# Patient Record
Sex: Female | Born: 1995 | Race: Black or African American | Hispanic: No | Marital: Single | Smoking: Never smoker
Health system: Southern US, Community
[De-identification: ages and names within clinical notes are randomized; demographics above are authoritative.]

## PROBLEM LIST (undated history)

## (undated) DIAGNOSIS — M1990 Unspecified osteoarthritis, unspecified site: Secondary | ICD-10-CM

---

## 2014-02-04 ENCOUNTER — Emergency Department (HOSPITAL_COMMUNITY): Payer: PRIVATE HEALTH INSURANCE

## 2014-02-04 ENCOUNTER — Encounter (HOSPITAL_COMMUNITY): Payer: Self-pay | Admitting: Emergency Medicine

## 2014-02-04 ENCOUNTER — Emergency Department (INDEPENDENT_AMBULATORY_CARE_PROVIDER_SITE_OTHER): Payer: PRIVATE HEALTH INSURANCE

## 2014-02-04 ENCOUNTER — Emergency Department (INDEPENDENT_AMBULATORY_CARE_PROVIDER_SITE_OTHER)
Admission: EM | Admit: 2014-02-04 | Discharge: 2014-02-04 | Disposition: A | Discharge status: Home / Self Care | Payer: PRIVATE HEALTH INSURANCE | Source: Home / Self Care | Attending: Family Medicine | Admitting: Family Medicine

## 2014-02-04 DIAGNOSIS — S62609B Fracture of unspecified phalanx of unspecified finger, initial encounter for open fracture: Secondary | ICD-10-CM

## 2014-02-04 DIAGNOSIS — IMO0002 Reserved for concepts with insufficient information to code with codable children: Secondary | ICD-10-CM

## 2014-02-04 HISTORY — DX: Unspecified osteoarthritis, unspecified site: M19.90

## 2014-02-04 MED ORDER — CEPHALEXIN 500 MG PO CAPS: 500.0000 mg | ORAL_CAPSULE | Freq: Three times a day (TID) | ORAL | Status: AC

## 2014-02-04 MED ORDER — HYDROCODONE-ACETAMINOPHEN 5-325 MG PO TABS: 1.0000 | ORAL_TABLET | ORAL | Status: AC | PRN

## 2014-02-04 NOTE — ED Notes (Signed)
Left ring finger injury yesterday, closed in a door

## 2014-02-04 NOTE — Discharge Instructions (Signed)
Finger Fracture °Fractures of fingers are breaks in the bones of the fingers. There are many types of fractures. There are different ways of treating these fractures. Your health care provider will discuss the best way to treat your fracture. °CAUSES °Traumatic injury is the main cause of broken fingers. These include: °· Injuries while playing sports. °· Workplace injuries. °· Falls. °RISK FACTORS °Activities that can increase your risk of finger fractures include: °· Sports. °· Workplace activities that involve machinery. °· A condition called osteoporosis, which can make your bones less dense and cause them to fracture more easily. °SIGNS AND SYMPTOMS °The main symptoms of a broken finger are pain and swelling within 15 minutes after the injury. Other symptoms include: °· Bruising of your finger. °· Stiffness of your finger. °· Numbness of your finger. °· Exposed bones (compound fracture) if the fracture is severe. °DIAGNOSIS  °The best way to diagnose a broken bone is with X-ray imaging. Additionally, your health care provider will use this X-ray image to evaluate the position of the broken finger bones.  °TREATMENT  °Finger fractures can be treated with:  °· Nonreduction--This means the bones are in place. The finger is splinted without changing the positions of the bone pieces. The splint is usually left on for about a week to 10 days. This will depend on your fracture and what your health care provider thinks. °· Closed reduction--The bones are put back into position without using surgery. The finger is then splinted. °· Open reduction and internal fixation--The fracture site is opened. Then the bone pieces are fixed into place with pins or some type of hardware. This is seldom required. It depends on the severity of the fracture. °HOME CARE INSTRUCTIONS  °· Follow your health care provider's instructions regarding activities, exercises, and physical therapy. °· Only take over-the-counter or prescription  medicines for pain, discomfort, or fever as directed by your health care provider. °SEEK MEDICAL CARE IF: °You have pain or swelling that limits the motion or use of your fingers. °SEEK IMMEDIATE MEDICAL CARE IF:  °Your finger becomes numb. °MAKE SURE YOU:  °· Understand these instructions. °· Will watch your condition. °· Will get help right away if you are not doing well or get worse. °Document Released: 08/16/2000 Document Revised: 02/22/2013 Document Reviewed: 12/14/2012 °[employer]® Patient Information ©2015 [employer]. This information is not intended to replace advice given to you by your health care provider. Make sure you discuss any questions you have with your health care provider. ° °Cast or Splint Care °Casts and splints support injured limbs and keep bones from moving while they heal.  °HOME CARE °· Keep the cast or splint uncovered during the drying period. °¨ A plaster cast can take 24 to 48 hours to dry. °¨ A fiberglass cast will dry in less than 1 hour. °· Do not rest the cast on anything harder than a pillow for 24 hours. °· Do not put weight on your injured limb. Do not put pressure on the cast. Wait for your doctor's approval. °· Keep the cast or splint dry. °¨ Cover the cast or splint with a plastic bag during baths or wet weather. °¨ If you have a cast over your chest and belly (trunk), take sponge baths until the cast is taken off. °¨ If your cast gets wet, dry it with a towel or blow dryer. Use the cool setting on the blow dryer. °· Keep your cast or splint clean. Wash a dirty cast with a damp   cloth. °· Do not put any objects under your cast or splint. °· Do not scratch the skin under the cast with an object. If itching is a problem, use a blow dryer on a cool setting over the itchy area. °· Do not trim or cut your cast. °· Do not take out the padding from inside your cast. °· Exercise your joints near the cast as told by your doctor. °· Raise (elevate) your injured limb on 1 or 2 pillows  for the first 1 to 3 days. °GET HELP IF: °· Your cast or splint cracks. °· Your cast or splint is too tight or too loose. °· You itch badly under the cast. °· Your cast gets wet or has a soft spot. °· You have a bad smell coming from the cast. °· You get an object stuck under the cast. °· Your skin around the cast becomes red or sore. °· You have new or more pain after the cast is put on. °GET HELP RIGHT AWAY IF: °· You have fluid leaking through the cast. °· You cannot move your fingers or toes. °· Your fingers or toes turn blue or white or are cool, painful, or puffy (swollen). °· You have tingling or lose feeling (numbness) around the injured area. °· You have bad pain or pressure under the cast. °· You have trouble breathing or have shortness of breath. °· You have chest pain. °Document Released: 09/03/2010 Document Revised: 01/04/2013 Document Reviewed: 11/10/2012 °[employer]® Patient Information ©2015 [employer]. This information is not intended to replace advice given to you by your health care provider. Make sure you discuss any questions you have with your health care provider. ° °

## 2014-02-04 NOTE — ED Provider Notes (Signed)
CSN: [id number]     Arrival date & time 02/04/14  1541 History   First MD Initiated Contact with Patient 02/04/14 1552     Chief Complaint  Patient presents with  . Finger Injury   (Consider location/radiation/quality/duration/timing/severity/associated sxs/prior Treatment)  HPI  Patient is a 18 year old female who slammed her fourth left finger in a door yesterday. Patient presenting for finger pain and bleeding from under the nail bed.  Patient confirms last tetanus 3 years ago.  Past Medical History  Diagnosis Date  . Arthritis    History reviewed. No pertinent past surgical history. History reviewed. No pertinent family history. History  Substance Use Topics  . Smoking status: Never Smoker   . Smokeless tobacco: Not on file  . Alcohol Use: No   OB History   Grav Para Term Preterm Abortions TAB SAB Ect Mult Living                 Review of Systems  Constitutional: Negative.   HENT: Negative.   Eyes: Negative.   Respiratory: Negative.   Cardiovascular: Negative.   Gastrointestinal: Negative.   Endocrine: Negative.   Genitourinary: Negative.   Skin: Positive for wound. Negative for color change, pallor and rash.  Allergic/Immunologic: Negative.   Neurological: Negative.  Negative for numbness.  Hematological: Negative.   Psychiatric/Behavioral: Negative.     Allergies  Review of patient's allergies indicates not on file.  Home Medications   Prior to Admission medications   Medication Sig Start Date End Date Taking? Authorizing Provider  norethindrone-ethinyl estradiol (JUNEL FE,GILDESS FE,LOESTRIN FE) 1-20 MG-MCG tablet Take 1 tablet by mouth daily.   Yes Historical Provider, MD  cephALEXin (KEFLEX) 500 MG capsule Take 1 capsule (500 mg total) by mouth 3 (three) times daily. 02/04/14   Servando Salina, NP  HYDROcodone-acetaminophen (NORCO/VICODIN) 5-325 MG per tablet Take 1-2 tablets by mouth every 4 (four) hours as needed. 02/04/14   Servando Salina, NP    BP 137/80  Pulse 75  Temp(Src) 98.5 F (36.9 C) (Oral)  Resp 16  LMP 01/21/2014  Physical Exam  Nursing note and vitals reviewed. Constitutional: She is oriented to person, place, and time. She appears well-developed and well-nourished. No distress.  Cardiovascular: Normal rate, regular rhythm, normal heart sounds and intact distal pulses.  Exam reveals no gallop and no friction rub.   No murmur heard. Pulmonary/Chest: Effort normal and breath sounds normal. No respiratory distress. She has no wheezes. She has no rales. She exhibits no tenderness.  Musculoskeletal: Normal range of motion. She exhibits edema and tenderness.       Left hand: She exhibits tenderness and swelling. She exhibits normal two-point discrimination, normal capillary refill and no deformity. Normal sensation noted. Normal strength noted.       Hands: DIP joint flexion and extension intact. Cap Refill <2 seconds.  Neurological: She is alert and oriented to person, place, and time.  Skin: Skin is warm and dry. She is not diaphoretic.     ED Course  Procedures (including critical care time) Labs Review Labs Reviewed - No data to display  Imaging Review Dg Finger Ring Left  02/04/2014   CLINICAL DATA:  Crush injury to the left ring finger  EXAM: LEFT RING FINGER 2+V  COMPARISON:  None.  FINDINGS: There is a nondisplaced oblique fracture through the distal aspect of the distal phalanx of the fourth digit. Overlying soft tissue swelling. No evidence for associated acute fractures.  IMPRESSION: Oblique fracture through the distal  aspect of the distal phalanx of the fourth digit.   Electronically Signed   By: Annia Belt M.D.   On: 02/04/2014 16:15           The patient's finger was soaked in solution of saline and peroxide and nail polish was removed for increased visualization. No evidence of subungual hematoma, however nail is cracked along cuticle line and dry blood removed.  Patient refuses digital block  for evaluation of possible partial nail avulsion.  Nail left in place at this time.    MDM   1. Finger fracture, open, initial encounter    Meds ordered this encounter  Medications  . norethindrone-ethinyl estradiol (JUNEL FE,GILDESS FE,LOESTRIN FE) 1-20 MG-MCG tablet    Sig: Take 1 tablet by mouth daily.  Marland Kitchen HYDROcodone-acetaminophen (NORCO/VICODIN) 5-325 MG per tablet    Sig: Take 1-2 tablets by mouth every 4 (four) hours as needed.    Dispense:  30 tablet    Refill:  0  . cephALEXin (KEFLEX) 500 MG capsule    Sig: Take 1 capsule (500 mg total) by mouth 3 (three) times daily.    Dispense:  30 capsule    Refill:  0   Patient is to call [facility] orthopedics for evaluation by hand surgeon in 2 days.  The patient verbalizes understanding and agrees to plan of care.          Servando Salina, NP 02/04/14 1719

## 2014-02-04 NOTE — ED Notes (Signed)
Finger splint applied after antibiotic gauze placed

## 2014-02-06 NOTE — ED Provider Notes (Signed)
Medical screening examination/treatment/procedure(s) were performed by a resident physician or non-physician practitioner and as the supervising physician I was immediately available for consultation/collaboration.  Constantino Starace, MD    Marcellene Shivley S Rhianna Raulerson, MD 02/06/14 0749 

## 2014-03-16 ENCOUNTER — Emergency Department (INDEPENDENT_AMBULATORY_CARE_PROVIDER_SITE_OTHER)
Admission: EM | Admit: 2014-03-16 | Discharge: 2014-03-16 | Disposition: A | Discharge status: Home / Self Care | Payer: PRIVATE HEALTH INSURANCE | Source: Home / Self Care | Attending: Emergency Medicine | Admitting: Emergency Medicine

## 2014-03-16 ENCOUNTER — Encounter (HOSPITAL_COMMUNITY): Payer: Self-pay | Admitting: Emergency Medicine

## 2014-03-16 DIAGNOSIS — J4521 Mild intermittent asthma with (acute) exacerbation: Secondary | ICD-10-CM

## 2014-03-16 MED ORDER — PREDNISONE 20 MG PO TABS: ORAL_TABLET | ORAL | Status: AC

## 2014-03-16 MED ORDER — GUAIFENESIN-CODEINE 100-10 MG/5ML PO SYRP: 5.0000 mL | ORAL_SOLUTION | Freq: Three times a day (TID) | ORAL | Status: AC | PRN

## 2014-03-16 MED ORDER — PREDNISONE 20 MG PO TABS
60.0000 mg | ORAL_TABLET | Freq: Once | ORAL | Status: AC
  Administered 2014-03-16: 60 mg via ORAL

## 2014-03-16 MED ORDER — PREDNISONE 20 MG PO TABS
ORAL_TABLET | ORAL | Status: AC
  Filled 2014-03-16: qty 3

## 2014-03-16 MED ORDER — ALBUTEROL SULFATE (2.5 MG/3ML) 0.083% IN NEBU
2.5000 mg | INHALATION_SOLUTION | Freq: Once | RESPIRATORY_TRACT | Status: AC
  Administered 2014-03-16: 2.5 mg via RESPIRATORY_TRACT

## 2014-03-16 MED ORDER — IPRATROPIUM-ALBUTEROL 0.5-2.5 (3) MG/3ML IN SOLN
RESPIRATORY_TRACT | Status: AC
  Filled 2014-03-16: qty 3

## 2014-03-16 MED ORDER — IPRATROPIUM-ALBUTEROL 0.5-2.5 (3) MG/3ML IN SOLN
3.0000 mL | Freq: Once | RESPIRATORY_TRACT | Status: AC
  Administered 2014-03-16: 3 mL via RESPIRATORY_TRACT

## 2014-03-16 MED ORDER — ALBUTEROL SULFATE (2.5 MG/3ML) 0.083% IN NEBU
INHALATION_SOLUTION | RESPIRATORY_TRACT | Status: AC
  Filled 2014-03-16: qty 3

## 2014-03-16 MED ORDER — ALBUTEROL SULFATE (2.5 MG/3ML) 0.083% IN NEBU: 2.5000 mg | INHALATION_SOLUTION | Freq: Four times a day (QID) | RESPIRATORY_TRACT | Status: AC | PRN

## 2014-03-16 MED ORDER — BECLOMETHASONE DIPROPIONATE 80 MCG/ACT IN AERS: 2.0000 | INHALATION_SPRAY | Freq: Two times a day (BID) | RESPIRATORY_TRACT | Status: AC

## 2014-03-16 NOTE — Discharge Instructions (Signed)

## 2014-03-16 NOTE — ED Notes (Signed)
C/o asthma flare up onset 2 days Sx include: SOB, wheezing, productive cough, vomiting due to cough, congestion , runny nose Denies f/n/d Taking her rescue inhalers w/little relief Alert, no signs of acute distress.

## 2014-03-16 NOTE — ED Provider Notes (Signed)
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 She developed upper respiratory infection symptoms about a week ago. Right now the URI symptoms have gone away, but ever since then she's had wheezing, chest tightness, and cough productive yellow sputum. She has mild nasal congestion right now but no fever or sore throat. The patient states she has been hospitalized for asthma in the past, but never been on a ventilator. She uses albuterol on an as-needed basis for control of her asthma.  Review of Systems   Other than as noted above, the patient denies any of the following symptoms. Systemic:  No fever, chills, or sweats. ENT:  No nasal congestion, sneezing, rhinorrhea, or sore throat. Lungs:  No cough, sputum production, or shortness of breath. No chest pain. Skin:  No rash or itching.  PMFSH   Past medical history, family history, social history, meds, and allergies were reviewed.   Physical Examination    Vital signs:  BP 128/90  Pulse 79  Temp(Src) 98.2 F (36.8 C) (Oral)  Resp 16  SpO2 99%  LMP 03/16/2014 General:  Alert, in no distress. Able to speak in full sentences. Has normal respiratory effort.  Eye:  No conjunctival injection or drainage. Lids were normal. ENT:  TMs and canals were normal, without erythema or inflammation.  Nasal mucosa was clear and uncongested, without drainage.  Mucous membranes were moist.  Pharynx was clear, without exudate or drainage.  There were no oral ulcerations or lesions. Neck:  Supple, no adenopathy, tenderness or mass. Lungs:  No retractions or use of accessory muscles.  No respiratory distress. She has bilateral inspiratory and expiratory wheezes, no rales or rhonchi, good air movement. Heart:  Regular rhythm, without gallops, murmers or rubs. Skin:  Clear, warm, and dry, without rash or lesions.  Course in Urgent Care  Center   The following medications were given:  Medications  ipratropium-albuterol (DUONEB) 0.5-2.5 (3) MG/3ML nebulizer solution 3 mL (3 mLs Nebulization Given 03/16/14 1739)  albuterol (PROVENTIL) (2.5 MG/3ML) 0.083% nebulizer solution 2.5 mg (2.5 mg Nebulization Given 03/16/14 1739)  predniSONE (DELTASONE) tablet 60 mg (60 mg Oral Given 03/16/14 1740)   After the above treatments she felt a lot better. Her lungs were improved and for the most part wheeze free. She felt that she could go on with this point.  Assessment   The encounter diagnosis was Asthma, mild intermittent, with acute exacerbation.  Plan    1.  Meds:  The following meds were prescribed:   Discharge Medication List as of 03/16/2014  6:22 PM    START taking these medications   Details  albuterol (PROVENTIL) (2.5 MG/3ML) 0.083% nebulizer solution Take 3 mLs (2.5 mg total) by nebulization every 6 (six) hours as needed for wheezing., Starting 03/16/2014, Until Discontinued, Normal    beclomethasone (QVAR) 80 MCG/ACT inhaler Inhale 2 puffs into the lungs 2 (two) times daily., Starting 03/16/2014, Until Discontinued, Normal    guaiFENesin-codeine (ROBITUSSIN AC) 100-10 MG/5ML syrup Take 5 mLs by mouth 3 (three) times daily as needed for cough., Starting 03/16/2014, Until Discontinued, Print    predniSONE (DELTASONE) 20 MG tablet Take 3 daily for 5 days, 2 daily for 5 days, 1 daily for 5 days., Normal        2.  Patient Education/Counseling:  The patient was given appropriate handouts, self care instructions, and instructed in symptomatic  relief.    3.  Follow up:  The patient was told to follow up here if no better in 2 days, or sooner if becoming worse in any way, and given some red flag symptoms such as increasing difficulty breathing which would prompt immediate return.         Reuben Likesavid C Detrich Rakestraw, MD 03/16/14 2113

## 2014-03-16 NOTE — ED Notes (Signed)
Written Rx for a nebulizer given to pt from dr. Lorenz CoasterKeller,

## 2016-02-15 IMAGING — CR DG FINGER RING 2+V*L*
3 series · 3 of 3 positions shown · non-contrast
Comparison: None.

CLINICAL DATA: Crush injury to the left ring finger

EXAM:
LEFT RING FINGER 2+V

[finger ap]
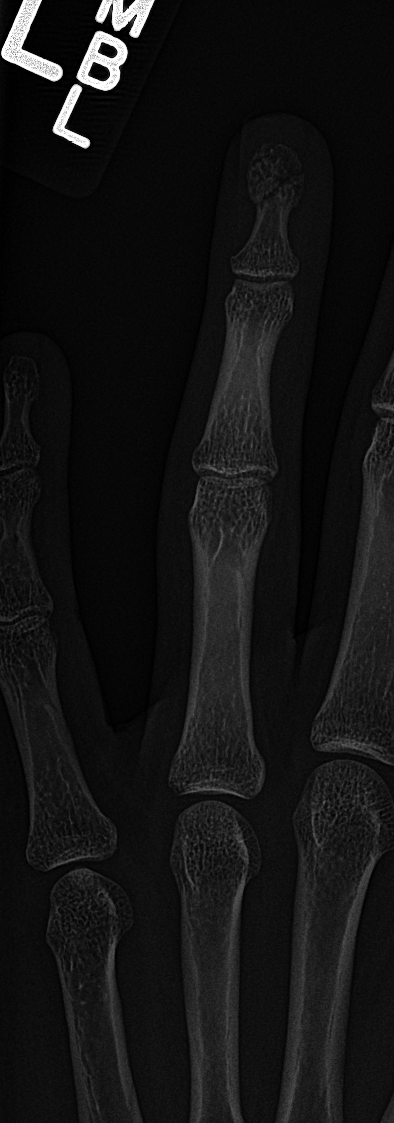

[finger lat]
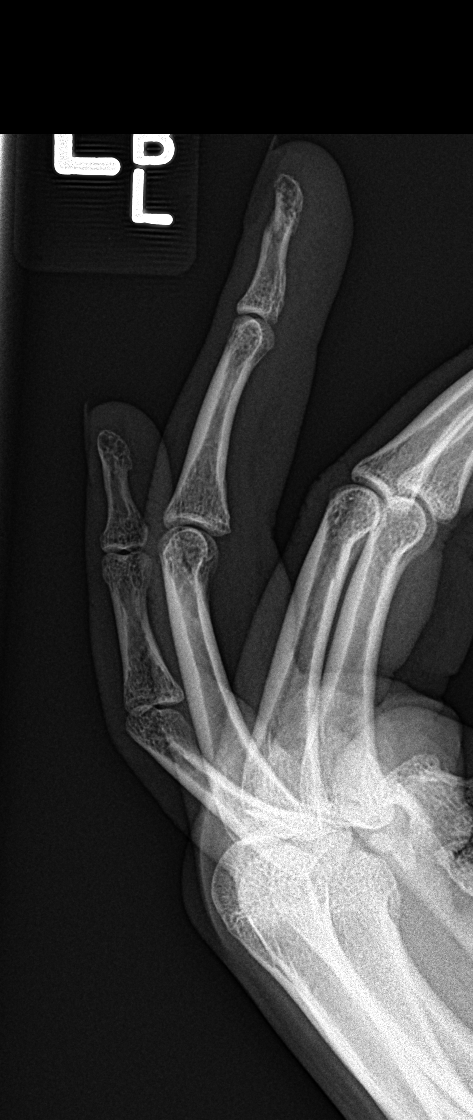

[finger obl]
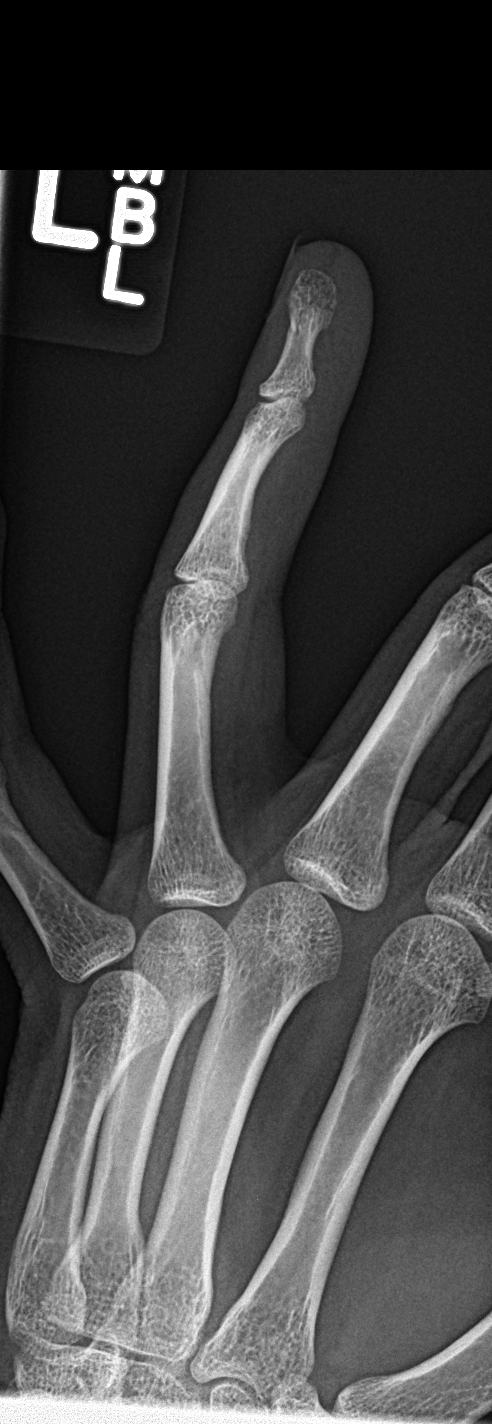

[3 of 3 positions shown; findings below may reference images not displayed]

FINDINGS: There is a nondisplaced oblique fracture through the distal aspect
of the distal phalanx of the fourth digit. Overlying soft tissue
swelling. No evidence for associated acute fractures.
IMPRESSION: Oblique fracture through the distal aspect of the distal phalanx of
the fourth digit.
